# Patient Record
Sex: Male | Born: 1970 | Race: Black or African American | Hispanic: No | Marital: Single | Smoking: Never smoker
Health system: Southern US, Community
[De-identification: ages and names within clinical notes are randomized; demographics above are authoritative.]

---

## 1998-02-19 ENCOUNTER — Emergency Department (HOSPITAL_COMMUNITY): Admission: EM | Admit: 1998-02-19 | Discharge: 1998-02-19 | Payer: Self-pay | Admitting: Emergency Medicine

## 1998-11-05 ENCOUNTER — Emergency Department (HOSPITAL_COMMUNITY): Admission: EM | Admit: 1998-11-05 | Discharge: 1998-11-05 | Payer: Self-pay | Admitting: Emergency Medicine

## 2004-01-25 ENCOUNTER — Emergency Department (HOSPITAL_COMMUNITY): Admission: AD | Admit: 2004-01-25 | Discharge: 2004-01-25 | Payer: Self-pay | Admitting: Family Medicine

## 2004-01-27 ENCOUNTER — Emergency Department (HOSPITAL_COMMUNITY): Admission: EM | Admit: 2004-01-27 | Discharge: 2004-01-27 | Payer: Self-pay | Admitting: Family Medicine

## 2004-07-01 ENCOUNTER — Emergency Department (HOSPITAL_COMMUNITY): Admission: EM | Admit: 2004-07-01 | Discharge: 2004-07-01 | Payer: Self-pay | Admitting: Emergency Medicine

## 2004-07-10 ENCOUNTER — Emergency Department (HOSPITAL_COMMUNITY): Admission: EM | Admit: 2004-07-10 | Discharge: 2004-07-10 | Payer: Self-pay | Admitting: Family Medicine

## 2004-12-01 ENCOUNTER — Emergency Department (HOSPITAL_COMMUNITY): Admission: EM | Admit: 2004-12-01 | Discharge: 2004-12-01 | Payer: Self-pay | Admitting: Family Medicine

## 2004-12-07 ENCOUNTER — Emergency Department (HOSPITAL_COMMUNITY): Admission: EM | Admit: 2004-12-07 | Discharge: 2004-12-07 | Payer: Self-pay | Admitting: Family Medicine

## 2005-05-17 ENCOUNTER — Emergency Department (HOSPITAL_COMMUNITY): Admission: EM | Admit: 2005-05-17 | Discharge: 2005-05-17 | Payer: Self-pay | Admitting: Family Medicine

## 2005-07-29 ENCOUNTER — Emergency Department (HOSPITAL_COMMUNITY): Admission: EM | Admit: 2005-07-29 | Discharge: 2005-07-29 | Payer: Self-pay | Admitting: Emergency Medicine

## 2005-08-04 ENCOUNTER — Emergency Department (HOSPITAL_COMMUNITY): Admission: EM | Admit: 2005-08-04 | Discharge: 2005-08-04 | Payer: Self-pay | Admitting: Family Medicine

## 2006-10-20 ENCOUNTER — Emergency Department (HOSPITAL_COMMUNITY): Admission: EM | Admit: 2006-10-20 | Discharge: 2006-10-20 | Payer: Self-pay | Admitting: Family Medicine

## 2008-01-11 ENCOUNTER — Emergency Department (HOSPITAL_COMMUNITY): Admission: EM | Admit: 2008-01-11 | Discharge: 2008-01-11 | Payer: Self-pay | Admitting: Emergency Medicine

## 2008-06-27 ENCOUNTER — Emergency Department (HOSPITAL_COMMUNITY): Admission: EM | Admit: 2008-06-27 | Discharge: 2008-06-27 | Payer: Self-pay | Admitting: Emergency Medicine

## 2008-11-26 ENCOUNTER — Emergency Department (HOSPITAL_COMMUNITY): Admission: EM | Admit: 2008-11-26 | Discharge: 2008-11-26 | Payer: Self-pay | Admitting: Emergency Medicine

## 2009-05-15 ENCOUNTER — Emergency Department (HOSPITAL_COMMUNITY): Admission: EM | Admit: 2009-05-15 | Discharge: 2009-05-15 | Payer: Self-pay | Admitting: Emergency Medicine

## 2009-11-10 ENCOUNTER — Emergency Department (HOSPITAL_COMMUNITY): Admission: EM | Admit: 2009-11-10 | Discharge: 2009-11-10 | Payer: Self-pay | Admitting: Emergency Medicine

## 2010-09-27 ENCOUNTER — Emergency Department (HOSPITAL_COMMUNITY)
Admission: EM | Admit: 2010-09-27 | Discharge: 2010-09-27 | Payer: Self-pay | Source: Home / Self Care | Admitting: Emergency Medicine

## 2010-12-30 ENCOUNTER — Emergency Department (HOSPITAL_COMMUNITY)
Admission: EM | Admit: 2010-12-30 | Discharge: 2010-12-30 | Disposition: A | Discharge status: Home / Self Care | Payer: Self-pay | Attending: Emergency Medicine | Admitting: Emergency Medicine

## 2010-12-30 DIAGNOSIS — K6289 Other specified diseases of anus and rectum: Secondary | ICD-10-CM | POA: Insufficient documentation

## 2010-12-30 DIAGNOSIS — K649 Unspecified hemorrhoids: Secondary | ICD-10-CM | POA: Insufficient documentation

## 2011-01-04 ENCOUNTER — Inpatient Hospital Stay (INDEPENDENT_AMBULATORY_CARE_PROVIDER_SITE_OTHER)
Admission: RE | Admit: 2011-01-04 | Discharge: 2011-01-04 | Disposition: A | Discharge status: Home / Self Care | Payer: Self-pay | Source: Ambulatory Visit

## 2011-01-04 DIAGNOSIS — R369 Urethral discharge, unspecified: Secondary | ICD-10-CM

## 2011-01-04 LAB — RPR: RPR Ser Ql: REACTIVE — AB

## 2011-01-04 LAB — RPR TITER: RPR Titer: 1:4 {titer} — AB

## 2011-01-04 LAB — HIV ANTIBODY (ROUTINE TESTING W REFLEX): HIV: NONREACTIVE

## 2011-01-08 ENCOUNTER — Inpatient Hospital Stay (INDEPENDENT_AMBULATORY_CARE_PROVIDER_SITE_OTHER)
Admission: RE | Admit: 2011-01-08 | Discharge: 2011-01-08 | Disposition: A | Discharge status: Home / Self Care | Payer: Self-pay | Source: Ambulatory Visit | Attending: Family Medicine | Admitting: Family Medicine

## 2011-01-08 DIAGNOSIS — A539 Syphilis, unspecified: Secondary | ICD-10-CM

## 2011-01-20 LAB — GC/CHLAMYDIA PROBE AMP, GENITAL: Chlamydia, DNA Probe: NEGATIVE

## 2011-01-30 LAB — URINE MICROSCOPIC-ADD ON

## 2011-01-30 LAB — URINALYSIS, ROUTINE W REFLEX MICROSCOPIC
Glucose, UA: NEGATIVE mg/dL
Hgb urine dipstick: NEGATIVE
Protein, ur: NEGATIVE mg/dL
Specific Gravity, Urine: 1.022 (ref 1.005–1.030)
Urobilinogen, UA: 1 mg/dL (ref 0.0–1.0)
pH: 7.5 (ref 5.0–8.0)

## 2012-01-26 ENCOUNTER — Emergency Department (INDEPENDENT_AMBULATORY_CARE_PROVIDER_SITE_OTHER)
Admission: EM | Admit: 2012-01-26 | Discharge: 2012-01-26 | Disposition: A | Discharge status: Home / Self Care | Payer: Self-pay | Source: Home / Self Care | Attending: Family Medicine | Admitting: Family Medicine

## 2012-01-26 ENCOUNTER — Emergency Department (INDEPENDENT_AMBULATORY_CARE_PROVIDER_SITE_OTHER): Payer: Self-pay

## 2012-01-26 ENCOUNTER — Encounter (HOSPITAL_COMMUNITY): Payer: Self-pay

## 2012-01-26 DIAGNOSIS — R05 Cough: Secondary | ICD-10-CM

## 2012-01-26 MED ORDER — BENZONATATE 100 MG PO CAPS: 200.0000 mg | ORAL_CAPSULE | Freq: Three times a day (TID) | ORAL | Status: AC | PRN

## 2012-01-26 MED ORDER — AZITHROMYCIN 250 MG PO TABS: 250.0000 mg | ORAL_TABLET | Freq: Every day | ORAL | Status: AC

## 2012-01-26 NOTE — Discharge Instructions (Signed)
Your xray is concerning for pneumonia. Take the antibiotics as directed; two pills on the first day, and then one pill each day for the next 4 days. Take cough pills as needed and as directed. For your chest soreness, you should take Aleve (or generic naproxen), one to two tablets, every 12 hours. You may also take acetaminophen (Tylenol), up to 1000 mg every 8 hours, in addition to further control your pain and fever. Return to care should your symptoms not improve, or worsen in any way.

## 2012-01-26 NOTE — ED Notes (Signed)
Registration staff requested an expedited evaluation for this pt on basis of chest pn complaint. Pt has had allergy symptoms including nasal drainage and cough since Saturday with chest pn beginning on Sunday. Chest pain is localized to central upper chest. Pt c/o general fatigue since Sunday and mild shortness of breath and a cough that "comes and goes."

## 2012-01-26 NOTE — ED Notes (Signed)
Pt has cough and chest "soreness" for one week.  Taking a herbal medication without improvement.

## 2012-01-26 NOTE — ED Provider Notes (Signed)
History     CSN: [id number]  Arrival date & time 01/26/12  1046   First MD Initiated Contact with Patient 01/26/12 1106      Chief Complaint  Patient presents with  . Cough    (Consider location/radiation/quality/duration/timing/severity/associated sxs/prior treatment) HPI Comments: Douglas Cooper presents for evaluation of persistent cough. He reports chest soreness, and pain with each cough. He reports fever and chills at onset of symptoms. He reports that he has been taking an "over the counter herbal supplement" that has helped. He denies smoking. He also reports that he has been more tired than usual, lately.   Patient is a 41 y.o. male presenting with cough. The history is provided by the patient.  Cough This is a new problem. The problem occurs constantly. The problem has not changed since onset.The cough is productive of sputum. There has been no fever. Associated symptoms include chest pain, chills, rhinorrhea and shortness of breath. He is not a smoker.    History reviewed. No pertinent past medical history.  History reviewed. No pertinent past surgical history.  History reviewed. No pertinent family history.  History  Substance Use Topics  . Smoking status: Never Smoker   . Smokeless tobacco: Not on file  . Alcohol Use: No      Review of Systems  Constitutional: Positive for chills.  HENT: Positive for congestion and rhinorrhea.   Eyes: Negative.   Respiratory: Positive for cough, chest tightness and shortness of breath.   Cardiovascular: Positive for chest pain.  Gastrointestinal: Negative.   Genitourinary: Negative.   Musculoskeletal: Negative.   Skin: Negative.   Neurological: Negative.     Allergies  Review of patient's allergies indicates no known allergies.  Home Medications   Current Outpatient Rx  Name Route Sig Dispense Refill  . OVER THE COUNTER MEDICATION  Herbal medication    . AZITHROMYCIN 250 MG PO TABS Oral Take 1 tablet (250 mg total) by  mouth daily. Take two tablets on first day, then one tablet each day for four days 6 tablet 0  . BENZONATATE 100 MG PO CAPS Oral Take 2 capsules (200 mg total) by mouth every 8 (eight) hours as needed for cough. 30 capsule 0    BP 129/85  Pulse 70  Temp(Src) 98.3 F (36.8 C) (Oral)  Resp 16  SpO2 100%  Physical Exam  Nursing note and vitals reviewed. Constitutional: He is oriented to person, place, and time. He appears well-developed and well-nourished.  HENT:  Head: Normocephalic and atraumatic.  Right Ear: Tympanic membrane normal.  Left Ear: Tympanic membrane normal.  Mouth/Throat: Uvula is midline, oropharynx is clear and moist and mucous membranes are normal.  Eyes: EOM are normal.  Neck: Normal range of motion.  Pulmonary/Chest: Effort normal. He has wheezes in the right middle field, the right lower field and the left lower field. He has no rhonchi.  Musculoskeletal: Normal range of motion.  Neurological: He is alert and oriented to person, place, and time.  Skin: Skin is warm and dry.  Psychiatric: His behavior is normal.    ED Course  Procedures (including critical care time)  Labs Reviewed - No data to display Dg Chest 2 View  01/26/2012  *RADIOLOGY REPORT*  Clinical Data: Cough  CHEST - 2 VIEW  Comparison: Chest radiograph 06/27/2008  Findings: Normal cardiac silhouette.  There is subtle obscuration of the right heart border.  There is trace right pleural effusion. No pneumothorax.  No pulmonary edema. No acute osseous abnormality.  IMPRESSION:  Concern for early right middle lobe or  lower lobe pneumonia.  Original Report Authenticated By: Genevive Bi, M.D.     1. Cough       MDM  Xray reviewed by radiologist and myself; concern for early pneumonia, per report above; rx given for azithromycin and tessalon perles; advised Aleve or ibuprofen for chest pain        Renaee Munda, MD 01/26/12 1500

## 2013-01-12 ENCOUNTER — Emergency Department (HOSPITAL_COMMUNITY)
Admission: EM | Admit: 2013-01-12 | Discharge: 2013-01-12 | Disposition: A | Discharge status: Home / Self Care | Payer: No Typology Code available for payment source | Attending: Emergency Medicine | Admitting: Emergency Medicine

## 2013-01-12 ENCOUNTER — Emergency Department (HOSPITAL_COMMUNITY): Payer: No Typology Code available for payment source

## 2013-01-12 ENCOUNTER — Encounter (HOSPITAL_COMMUNITY): Payer: Self-pay

## 2013-01-12 DIAGNOSIS — Y92410 Unspecified street and highway as the place of occurrence of the external cause: Secondary | ICD-10-CM | POA: Insufficient documentation

## 2013-01-12 DIAGNOSIS — Y9355 Activity, bike riding: Secondary | ICD-10-CM | POA: Insufficient documentation

## 2013-01-12 DIAGNOSIS — S59909A Unspecified injury of unspecified elbow, initial encounter: Secondary | ICD-10-CM | POA: Insufficient documentation

## 2013-01-12 DIAGNOSIS — G939 Disorder of brain, unspecified: Secondary | ICD-10-CM

## 2013-01-12 DIAGNOSIS — S0003XA Contusion of scalp, initial encounter: Secondary | ICD-10-CM | POA: Insufficient documentation

## 2013-01-12 DIAGNOSIS — S6990XA Unspecified injury of unspecified wrist, hand and finger(s), initial encounter: Secondary | ICD-10-CM | POA: Insufficient documentation

## 2013-01-12 DIAGNOSIS — S0083XA Contusion of other part of head, initial encounter: Secondary | ICD-10-CM | POA: Insufficient documentation

## 2013-01-12 DIAGNOSIS — S0093XA Contusion of unspecified part of head, initial encounter: Secondary | ICD-10-CM

## 2013-01-12 MED ORDER — HYDROCODONE-ACETAMINOPHEN 7.5-325 MG/15ML PO SOLN
10.0000 mL | Freq: Once | ORAL | Status: AC
  Administered 2013-01-12: 10 mL via ORAL
  Filled 2013-01-12: qty 15

## 2013-01-12 MED ORDER — HYDROCODONE-ACETAMINOPHEN 5-325 MG PO TABS
1.0000 | ORAL_TABLET | Freq: Four times a day (QID) | ORAL | Status: AC | PRN
Start: 2013-01-12 — End: ?
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
## 2013-01-12 NOTE — ED Notes (Signed)
Patient transported to X-ray 
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
## 2013-01-12 NOTE — Progress Notes (Signed)
During [facility] ED 01/12/13 visit CM spoke with pt who was unsure if he had insurance coverage through his job at "[employer] [address], [place], [place] [address] [phone] Pt does have contact number for his "boss" Thayer Ohm at bedside--[phone]-- who confirmed pt is a self pay [employer] [place] resident with no pcp.  CM discussed and provided written information for Thayer Ohm to review for self pay pcps, importance of pcp for f/u care, www.needymeds.org, discounted pharmacies, MATCH program and other [place] resources such as financial assistance, DSS and  health department Reviewed Health connect number to assist with finding self pay provider close to pt's residence. Reviewed resources for [place] self pay pcps like [facility], family medicine at [address], [facility] family practice, general medical clinics, [facility] urgent care plus others, [facility] out patient [facility]ies, housing, affordable care act/health reform (deadline 01/12/13) and other resources in [employer]. Pt voiced understanding and appreciation of resources provided

## 2013-01-12 NOTE — ED Notes (Signed)
Pt escorted to discharge window. Verbalized understanding discharge instructions. In no acute distress.   

## 2013-01-15 NOTE — ED Provider Notes (Signed)
Medical screening examination/treatment/procedure(s) were performed by non-physician practitioner and as supervising physician I was immediately available for consultation/collaboration.  Brandi Armato, MD 01/15/13 0718 

## 2015-03-01 ENCOUNTER — Emergency Department (HOSPITAL_COMMUNITY)
Admission: EM | Admit: 2015-03-01 | Discharge: 2015-03-01 | Disposition: A | Discharge status: Home / Self Care | Payer: No Typology Code available for payment source | Attending: Emergency Medicine | Admitting: Emergency Medicine

## 2015-03-01 ENCOUNTER — Encounter (HOSPITAL_COMMUNITY): Payer: Self-pay

## 2015-03-01 DIAGNOSIS — S6992XA Unspecified injury of left wrist, hand and finger(s), initial encounter: Secondary | ICD-10-CM | POA: Diagnosis present

## 2015-03-01 DIAGNOSIS — Y288XXA Contact with other sharp object, undetermined intent, initial encounter: Secondary | ICD-10-CM | POA: Diagnosis not present

## 2015-03-01 DIAGNOSIS — Y998 Other external cause status: Secondary | ICD-10-CM | POA: Diagnosis not present

## 2015-03-01 DIAGNOSIS — Y9289 Other specified places as the place of occurrence of the external cause: Secondary | ICD-10-CM | POA: Diagnosis not present

## 2015-03-01 DIAGNOSIS — Y9339 Activity, other involving climbing, rappelling and jumping off: Secondary | ICD-10-CM | POA: Diagnosis not present

## 2015-03-01 DIAGNOSIS — Z23 Encounter for immunization: Secondary | ICD-10-CM | POA: Diagnosis not present

## 2015-03-01 DIAGNOSIS — S61412A Laceration without foreign body of left hand, initial encounter: Secondary | ICD-10-CM | POA: Diagnosis not present

## 2015-03-01 MED ORDER — SULFAMETHOXAZOLE-TRIMETHOPRIM 800-160 MG PO TABS
1.0000 | ORAL_TABLET | Freq: Once | ORAL | Status: AC
  Administered 2015-03-01: 1 via ORAL
  Filled 2015-03-01: qty 1

## 2015-03-01 MED ORDER — LIDOCAINE HCL (PF) 1 % IJ SOLN
5.0000 mL | Freq: Once | INTRAMUSCULAR | Status: AC
  Administered 2015-03-01: 5 mL via INTRADERMAL
  Filled 2015-03-01: qty 5

## 2015-03-01 MED ORDER — SULFAMETHOXAZOLE-TRIMETHOPRIM 800-160 MG PO TABS: 1.0000 | ORAL_TABLET | Freq: Two times a day (BID) | ORAL | Status: AC

## 2015-03-01 MED ORDER — TETANUS-DIPHTH-ACELL PERTUSSIS 5-2.5-18.5 LF-MCG/0.5 IM SUSP
0.5000 mL | Freq: Once | INTRAMUSCULAR | Status: AC
  Administered 2015-03-01: 0.5 mL via INTRAMUSCULAR
  Filled 2015-03-01: qty 0.5

## 2015-03-01 NOTE — ED Notes (Signed)
Pt with laceration to left hand.  Pt was running from dog and jumped fence. Hand laceration from fence.  Event happened 2 days ago.  ? Tetanus unknown.

## 2015-03-01 NOTE — Discharge Instructions (Signed)
As discussed, with the delay in addressing your hand injury is very important that you monitor the wound carefully, and do not hesitate to return if you develop new, or concerning changes in your condition.  Please take all medication as directed, keep the wound clean, dry.

## 2015-03-01 NOTE — ED Provider Notes (Signed)
CSN: [id number]     Arrival date & time 03/01/15  0844 History   First MD Initiated Contact with Patient 03/01/15 [phone]     Chief Complaint  Patient presents with  . Extremity Laceration     (Consider location/radiation/quality/duration/timing/severity/associated sxs/prior Treatment) HPI Patient presents with concern of open the left hand wound. The patient caught his hand on a barb wire fence 2 days ago. Since that time there's been pain persistently, sharp severe in the palmar distal hand. Patient has no loss of sensation or strength in any of the associated digits, no fever, chills. Patient presents today due to protruding soft tissue through the wound. History reviewed. No pertinent past medical history. History reviewed. No pertinent past surgical history. History reviewed. No pertinent family history. History  Substance Use Topics  . Smoking status: Never Smoker   . Smokeless tobacco: Not on file  . Alcohol Use: No    Review of Systems  Constitutional: Negative for fever.  Respiratory: Negative for shortness of breath.   Cardiovascular: Negative for chest pain.  Musculoskeletal:       Negative aside from HPI  Skin:       Negative aside from HPI  Allergic/Immunologic: Negative for immunocompromised state.  Neurological: Negative for weakness.      Allergies  Review of patient's allergies indicates no known allergies.  Home Medications   Prior to Admission medications   Medication Sig Start Date End Date Taking? Authorizing Provider  HYDROcodone-acetaminophen (NORCO/VICODIN) 5-325 MG per tablet Take 1 tablet by mouth every 6 (six) hours as needed for pain. Patient not taking: Reported on 03/01/2015 01/12/13   Jaci CarrelLisette Paz, PA-C  sulfamethoxazole-trimethoprim (BACTRIM DS,SEPTRA DS) 800-160 MG per tablet Take 1 tablet by mouth 2 (two) times daily. 03/01/15 03/08/15  Gerhard Munchobert [place], MD   BP 132/81 mmHg  Pulse 78  Temp(Src) 97.9 F (36.6 C) (Oral)  Resp 16  SpO2  99% Physical Exam  Constitutional: He is oriented to person, place, and time. He appears well-developed. No distress.  HENT:  Head: Normocephalic and atraumatic.  Eyes: Conjunctivae and EOM are normal.  Cardiovascular: Normal rate, regular rhythm and intact distal pulses.   Pulmonary/Chest: Effort normal. No stridor. No respiratory distress.  Abdominal: He exhibits no distension.  Musculoskeletal: He exhibits no edema.  Left hand patient flexes and extends all digits properly, wrist appropriately. About the midpoint of the distal palmar surface there is a 4 cm L-shaped laceration with protrusion of soft tissue, approximately 1 cm external to the wound  Neurological: He is alert and oriented to person, place, and time.  Skin: Skin is warm and dry.  Psychiatric: He has a normal mood and affect.  Nursing note and vitals reviewed.   ED Course  Procedures (including critical care time) After soaking for 20 minutes in Betadine solution, rinsing, the patient had wound closure  LACERATION REPAIR Performed by: Gerhard MunchLOCKWOOD, Mimie Goering Authorized by: Gerhard MunchLOCKWOOD, Chrisoula Zegarra Consent: Verbal consent obtained. Risks and benefits: risks, benefits and alternatives were discussed Consent given by: patient Patient identity confirmed: provided demographic data Prepped and Draped in normal sterile fashion Wound explored  Laceration Location: L hand palmar surface  Laceration Length: 4cm  No Foreign Bodies seen or palpated  Anesthesia: local infiltration  Local anesthetic: lidocaine 1% no epinephrine  Anesthetic total: 3 ml  Irrigation method: syringe Amount of cleaning: standard  Skin closure: 5-0 sutures  Number of sutures: 3  Technique: rough approximation to contain soft tissue  Patient tolerance: Patient tolerated the procedure well  with no immediate complications.   MDM   Final diagnoses:  Laceration of hand, left, initial encounter    Patient presents with a 562-day-old wound to his  left hand. Given the protrusion of soft tissue, the area, concern for infection, the wound was roughly approximated, to decrease likelihood for infection. Patient was also started on antibiotics, provided exposer return precautions, follow-up instructions.    Gerhard Munchobert Trevian Hayashida, MD 03/01/15 1011

## 2015-03-01 NOTE — ED Notes (Signed)
Pt soaking in hand in iodine. MD will plan to place sutures after 20-30 min of soaking.

## 2015-03-08 ENCOUNTER — Emergency Department (HOSPITAL_COMMUNITY)
Admission: EM | Admit: 2015-03-08 | Discharge: 2015-03-08 | Disposition: A | Discharge status: Home / Self Care | Payer: No Typology Code available for payment source | Attending: Emergency Medicine | Admitting: Emergency Medicine

## 2015-03-08 ENCOUNTER — Encounter (HOSPITAL_COMMUNITY): Payer: Self-pay | Admitting: Emergency Medicine

## 2015-03-08 DIAGNOSIS — Z4802 Encounter for removal of sutures: Secondary | ICD-10-CM | POA: Insufficient documentation

## 2015-03-08 NOTE — ED Notes (Signed)
Pt reports he had 3 stitches placed in L hand on Tuesday. Pt was washing dishes when stitches popped open. No bleeding at present.

## 2015-03-08 NOTE — Discharge Instructions (Signed)

## 2015-03-08 NOTE — ED Provider Notes (Signed)
CSN: [id number]     Arrival date & time 03/08/15  [id number] History   First MD Initiated Contact with Patient 03/08/15 [phone]     Chief Complaint  Patient presents with  . Suture / Staple Removal     (Consider location/radiation/quality/duration/timing/severity/associated sxs/prior Treatment) HPI Comments: Pt had sutures placed in his left hand 1 week ago. Pt state that the it looks like it started to separate while he was washing dishes. No redness, warmth or drainage  Patient is a 44 y.o. male presenting with suture removal. The history is provided by the patient. No language interpreter was used.  Suture / Staple Removal This is a new problem. The current episode started today. The problem occurs constantly. The problem has been unchanged. Pertinent negatives include no numbness. Nothing aggravates the symptoms. He has tried nothing for the symptoms.    History reviewed. No pertinent past medical history. History reviewed. No pertinent past surgical history. History reviewed. No pertinent family history. History  Substance Use Topics  . Smoking status: Never Smoker   . Smokeless tobacco: Not on file  . Alcohol Use: No    Review of Systems  Neurological: Negative for numbness.      Allergies  Review of patient's allergies indicates no known allergies.  Home Medications   Prior to Admission medications   Medication Sig Start Date End Date Taking? Authorizing Provider  HYDROcodone-acetaminophen (NORCO/VICODIN) 5-325 MG per tablet Take 1 tablet by mouth every 6 (six) hours as needed for pain. Patient not taking: Reported on 03/01/2015 01/12/13   Jaci CarrelLisette Paz, PA-C  sulfamethoxazole-trimethoprim (BACTRIM DS,SEPTRA DS) 800-160 MG per tablet Take 1 tablet by mouth 2 (two) times daily. 03/01/15 03/08/15  Gerhard Munchobert Lockwood, MD   There were no vitals taken for this visit. Physical Exam  Constitutional: He is oriented to person, place, and time.  Cardiovascular: Normal rate and regular  rhythm.   Pulmonary/Chest: Effort normal and breath sounds normal.  Neurological: He is alert and oriented to person, place, and time.  Skin:  Wound that in not opening or draining. No redness or warmth. No well approximated  Nursing note and vitals reviewed.   ED Course  SUTURE REMOVAL Date/Time: 03/08/2015 9:07 AM Performed by: Teressa LowerPICKERING, Melvern Ramone Authorized by: Teressa LowerPICKERING, Diavian Furgason Consent: Verbal consent obtained. Consent given by: patient Patient identity confirmed: verbally with patient Wound Appearance: clean Sutures Removed: 3 Patient tolerance: Patient tolerated the procedure well with no immediate complications   (including critical care time) Labs Review Labs Reviewed - No data to display  Imaging Review No results found.   EKG Interpretation None      MDM   Final diagnoses:  Visit for suture removal    Sutures removed. No sign of infection noted.    Teressa LowerVrinda Cato Liburd, NP 03/08/15 [id number]  Benjiman CoreNathan Jozey Janco, MD 03/08/15 1018

## 2017-11-08 DIAGNOSIS — S0180XA Unspecified open wound of other part of head, initial encounter: Secondary | ICD-10-CM | POA: Insufficient documentation

## 2017-11-08 DIAGNOSIS — Y9289 Other specified places as the place of occurrence of the external cause: Secondary | ICD-10-CM | POA: Insufficient documentation

## 2017-11-08 DIAGNOSIS — W272XXA Contact with scissors, initial encounter: Secondary | ICD-10-CM | POA: Insufficient documentation

## 2017-11-08 DIAGNOSIS — Y9389 Activity, other specified: Secondary | ICD-10-CM | POA: Insufficient documentation

## 2017-11-08 DIAGNOSIS — Y999 Unspecified external cause status: Secondary | ICD-10-CM | POA: Insufficient documentation

## 2017-11-09 ENCOUNTER — Emergency Department (HOSPITAL_COMMUNITY)
Admission: EM | Admit: 2017-11-09 | Discharge: 2017-11-09 | Disposition: A | Discharge status: Home / Self Care | Payer: No Typology Code available for payment source | Attending: Emergency Medicine | Admitting: Emergency Medicine

## 2017-11-09 ENCOUNTER — Encounter (HOSPITAL_COMMUNITY): Payer: Self-pay | Admitting: Emergency Medicine

## 2017-11-09 ENCOUNTER — Other Ambulatory Visit: Payer: Self-pay

## 2017-11-09 DIAGNOSIS — T148XXA Other injury of unspecified body region, initial encounter: Secondary | ICD-10-CM

## 2017-11-09 MED ORDER — BACITRACIN ZINC 500 UNIT/GM EX OINT
TOPICAL_OINTMENT | CUTANEOUS | Status: AC
  Administered 2017-11-09: 2
  Filled 2017-11-09: qty 1.8

## 2017-11-09 NOTE — Discharge Instructions (Signed)
Apply bacitracin ointment twice per day as needed.  You may take Tylenol or ibuprofen for discomfort.  Keep clean with warm soap and water.  Follow-up with a primary care doctor as needed.

## 2017-11-09 NOTE — ED Triage Notes (Signed)
Patient was getting haircut. Benna DunksBarber cut the mole off the right side of his face.

## 2017-11-09 NOTE — ED Notes (Signed)
Pt has a mole on the right side of his face. The mole has been cut while the pt was having his hair cut, bleeding is controlled, pt denies pain or any other complaints.

## 2017-11-09 NOTE — ED Provider Notes (Signed)
[facility]-EMERGENCY DEPT Provider Note   CSN: [id number] Arrival date & time: 11/08/17  2333     History   Chief Complaint Chief Complaint  Patient presents with  . Facial Laceration    HPI Hortencia PilarCalvin T Krysiak is a 47 y.o. male.  47 year old male presents to the emergency department for evaluation of a laceration to his scalp.  Patient states that he was having his haircut by his brother when the razor cut a skin tag on the side of his face.  Patient expresses concern about persistent bleeding prior to arrival.  Bleeding controlled prior to my evaluation of the patient.  He has no complaints of pain or tenderness.  No medications taken prior to arrival for symptoms.  No use of chronic anticoagulants.   The history is provided by the patient. No language interpreter was used.    History reviewed. No pertinent past medical history.  There are no active problems to display for this patient.   History reviewed. No pertinent surgical history.     Home Medications    Prior to Admission medications   Medication Sig Start Date End Date Taking? Authorizing Provider  HYDROcodone-acetaminophen (NORCO/VICODIN) 5-325 MG per tablet Take 1 tablet by mouth every 6 (six) hours as needed for pain. Patient not taking: Reported on 03/01/2015 01/12/13   Jaci CarrelPaz, Lisette, PA-C    Family History History reviewed. No pertinent family history.  Social History Social History   Tobacco Use  . Smoking status: Never Smoker  . Smokeless tobacco: Never Used  Substance Use Topics  . Alcohol use: No  . Drug use: No     Allergies   Patient has no known allergies.   Review of Systems Review of Systems Ten systems reviewed and are negative for acute change, except as noted in the HPI.    Physical Exam Updated Vital Signs BP (!) 132/93 (BP Location: Left Arm)   Pulse 67   Temp 98.4 F (36.9 C) (Oral)   Resp 13   Ht 5\' 7"  (1.702 m)   Wt 68 kg (150 lb)   SpO2 100%    BMI 23.49 kg/m   Physical Exam  Constitutional: He is oriented to person, place, and time. He appears well-developed and well-nourished. No distress.  Nontoxic appearing and in no acute distress  HENT:  Head: Normocephalic and atraumatic.    Grossly poor dentition  Eyes: Conjunctivae and EOM are normal. No scleral icterus.  Neck: Normal range of motion.  Pulmonary/Chest: Effort normal. No respiratory distress.  Respirations even and unlabored  Musculoskeletal: Normal range of motion.  Neurological: He is alert and oriented to person, place, and time. He exhibits normal muscle tone. Coordination normal.  Ambulatory with steady gait.  Skin: Skin is warm and dry. No rash noted. He is not diaphoretic. No erythema. No pallor.  Avulsion to skin tag of right temple.  No active bleeding; bleeding controlled prior to evaluation.  No tenderness to palpation.  Psychiatric: He has a normal mood and affect. His behavior is normal.  Nursing note and vitals reviewed.    ED Treatments / Results  Labs (all labs ordered are listed, but only abnormal results are displayed) Labs Reviewed - No data to display  EKG  EKG Interpretation None       Radiology No results found.  Procedures Procedures (including critical care time)  Medications Ordered in ED Medications - No data to display   Initial Impression / Assessment and Plan / ED Course  I have reviewed the triage vital signs and the nursing notes.  Pertinent labs & imaging results that were available during my care of the patient were reviewed by me and considered in my medical decision making (see chart for details).     47 year old male presents for avulsion to skin tag of the right temple.  Bleeding controlled.  No indication for repair.  No associated tenderness to palpation.  Patient stable for outpatient supportive management.  Return precautions discussed and provided. Patient discharged in stable condition with no  unaddressed concerns.   Final Clinical Impressions(s) / ED Diagnoses   Final diagnoses:  Skin avulsion    ED Discharge Orders    None       Antony Madura, PA-C 11/09/17 2130    Palumbo, April, MD 11/09/17 [phone]

## 2019-08-02 ENCOUNTER — Emergency Department (HOSPITAL_COMMUNITY)
Admission: EM | Admit: 2019-08-02 | Discharge: 2019-08-02 | Disposition: A | Discharge status: Home / Self Care | Payer: No Typology Code available for payment source | Attending: Emergency Medicine | Admitting: Emergency Medicine

## 2019-08-02 ENCOUNTER — Emergency Department (HOSPITAL_COMMUNITY): Payer: No Typology Code available for payment source

## 2019-08-02 ENCOUNTER — Encounter (HOSPITAL_COMMUNITY): Payer: Self-pay | Admitting: Emergency Medicine

## 2019-08-02 ENCOUNTER — Other Ambulatory Visit: Payer: Self-pay

## 2019-08-02 DIAGNOSIS — Z20828 Contact with and (suspected) exposure to other viral communicable diseases: Secondary | ICD-10-CM | POA: Insufficient documentation

## 2019-08-02 DIAGNOSIS — R079 Chest pain, unspecified: Secondary | ICD-10-CM | POA: Diagnosis present

## 2019-08-02 LAB — CBC
HCT: 49.7 % (ref 39.0–52.0)
Hemoglobin: 16.7 g/dL (ref 13.0–17.0)
MCH: 29 pg (ref 26.0–34.0)
MCHC: 33.6 g/dL (ref 30.0–36.0)
MCV: 86.4 fL (ref 80.0–100.0)
Platelets: 219 10*3/uL (ref 150–400)
RBC: 5.75 MIL/uL (ref 4.22–5.81)
RDW: 13.8 % (ref 11.5–15.5)
WBC: 5.4 10*3/uL (ref 4.0–10.5)
nRBC: 0 % (ref 0.0–0.2)

## 2019-08-02 LAB — BASIC METABOLIC PANEL
Anion gap: 8 (ref 5–15)
BUN: 8 mg/dL (ref 6–20)
CO2: 31 mmol/L (ref 22–32)
Calcium: 9.7 mg/dL (ref 8.9–10.3)
Chloride: 101 mmol/L (ref 98–111)
Creatinine, Ser: 0.92 mg/dL (ref 0.61–1.24)
GFR calc Af Amer: >60 mL/min (ref >60)
GFR calc non Af Amer: >60 mL/min (ref >60)
Glucose, Bld: 109 mg/dL — ABNORMAL HIGH (ref 70–99)
Potassium: 4.9 mmol/L (ref 3.5–5.1)
Sodium: 140 mmol/L (ref 135–145)

## 2019-08-02 LAB — TROPONIN I (HIGH SENSITIVITY): Troponin I (High Sensitivity): 3 ng/L (ref <18)

## 2019-08-02 MED ORDER — IBUPROFEN 400 MG PO TABS: 400.0000 mg | ORAL_TABLET | Freq: Four times a day (QID) | ORAL | 0 refills | Status: DC | PRN

## 2019-08-02 NOTE — ED Triage Notes (Signed)
Per pt, states CP radiating to back for 3 days-states he took some tylenol and it went away

## 2019-08-02 NOTE — Discharge Instructions (Signed)
Recommend follow-up with your primary doctor.  Recommend Tylenol, Motrin as needed for pain control.  Please return to ER if you develop worsening pain, difficulty breathing, fever or other new concerning symptom.  Recommend isolation precautions until we have the results of your coronavirus test.

## 2019-08-02 NOTE — ED Provider Notes (Signed)
[facility] DEPT Provider Note   CSN: [id number] Arrival date & time: 08/02/19  1532     History   Chief Complaint Chief Complaint  Patient presents with   Chest Pain    HPI Douglas Cooper is a 48 y.o. male.  Presents emerged part with chief complaint of chest pain.  Patient states his symptoms started approximately 3 days ago, left-sided, sharp, stabbing, radiates to back.  Patient states that pain went away yesterday after he took some Tylenol.  Today is not having any pain.  He denies any difficulty breathing.  Has noted a cough recently.  Dry, nonproductive.  No blood.  No fever, no sick contacts.     HPI  History reviewed. No pertinent past medical history.  There are no active problems to display for this patient.   History reviewed. No pertinent surgical history.      Home Medications    Prior to Admission medications   Medication Sig Start Date End Date Taking? Authorizing Provider  HYDROcodone-acetaminophen (NORCO/VICODIN) 5-325 MG per tablet Take 1 tablet by mouth every 6 (six) hours as needed for pain. Patient not taking: Reported on 03/01/2015 01/12/13   Verl Dicker, PA-C  ibuprofen (ADVIL) 400 MG tablet Take 1 tablet (400 mg total) by mouth every 6 (six) hours as needed. 08/02/19   Lucrezia Starch, MD    Family History No family history on file.  Social History Social History   Tobacco Use   Smoking status: Never Smoker   Smokeless tobacco: Never Used  Substance Use Topics   Alcohol use: No   Drug use: No     Allergies   Patient has no known allergies.   Review of Systems Review of Systems  Constitutional: Negative for chills and fever.  HENT: Negative for ear pain and sore throat.   Eyes: Negative for pain and visual disturbance.  Respiratory: Negative for cough and shortness of breath.   Cardiovascular: Negative for chest pain and palpitations.  Gastrointestinal: Negative for abdominal pain and  vomiting.  Genitourinary: Negative for dysuria and hematuria.  Musculoskeletal: Negative for arthralgias and back pain.  Skin: Negative for color change and rash.  Neurological: Negative for seizures and syncope.  All other systems reviewed and are negative.    Physical Exam Updated Vital Signs BP (!) 151/102 (BP Location: Right Arm)    Pulse 69    Temp 98 F (36.7 C) (Oral)    Resp 18    SpO2 99%   Physical Exam Vitals signs and nursing note reviewed.  Constitutional:      Appearance: He is well-developed.  HENT:     Head: Normocephalic and atraumatic.  Eyes:     Conjunctiva/sclera: Conjunctivae normal.  Neck:     Musculoskeletal: Neck supple.  Cardiovascular:     Rate and Rhythm: Normal rate and regular rhythm.     Heart sounds: No murmur.  Pulmonary:     Effort: Pulmonary effort is normal. No respiratory distress.     Breath sounds: Normal breath sounds.  Abdominal:     Palpations: Abdomen is soft.     Tenderness: There is no abdominal tenderness.  Musculoskeletal:     Right lower leg: No edema.     Left lower leg: No edema.  Skin:    General: Skin is warm and dry.     Capillary Refill: Capillary refill takes less than 2 seconds.  Neurological:     Mental Status: He is alert.  ED Treatments / Results  Labs (all labs ordered are listed, but only abnormal results are displayed) Labs Reviewed  BASIC METABOLIC PANEL - Abnormal; Notable for the following components:      Result Value   Glucose, Bld 109 (*)    All other components within normal limits  NOVEL CORONAVIRUS, NAA (HOSP ORDER, SEND-OUT TO REF LAB; TAT 18-24 HRS)  CBC  TROPONIN I (HIGH SENSITIVITY)  TROPONIN I (HIGH SENSITIVITY)    EKG EKG Interpretation  Date/Time:  Sunday August 02 2019 15:45:32 EDT Ventricular Rate:  71 PR Interval:    QRS Duration: 88 QT Interval:  359 QTC Calculation: 391 R Axis:   91 Text Interpretation:  Sinus rhythm ST elev, probable normal early repol pattern  No recipricol ST depressions no acute STEMI Confirmed by Alexiz Cothran ([id number]) on 08/02/2019 6:45:16 PM   Radiology Dg Chest 2 View  Result Date: 08/02/2019 CLINICAL DATA:  Chest pain EXAM: CHEST - 2 VIEW COMPARISON:  None. FINDINGS: Lungs are clear.  No pleural effusion or pneumothorax. The heart is normal in size. Visualized osseous structures are within normal limits. IMPRESSION: Normal chest radiographs. Electronically Signed   By: Sriyesh  Krishnan M.D.   On: 08/02/2019 16:19    Procedures Procedures (including critical care time)  Medications Ordered in ED Medications - No data to display   Initial Impression / Assessment and Plan / ED Course  I have reviewed the triage vital signs and the nursing notes.  Pertinent labs & imaging results that were available during my care of the patient were reviewed by me and considered in my medical decision making (see chart for details).       -year-old male presents to ER with cough, chest pain.  Patient actually reports not having any chest pain today.  His EKG was without acute ischemic changes, troponin within normal limits, labs otherwise unremarkable.  Chest x-ray negative for pneumonia.  Suspect possible muscle strain versus viral respiratory illness.  Will swab for COVID-19 and discharged home.    After the discussed management above, the patient was determined to be safe for discharge.  The patient was in agreement with this plan and all questions regarding their care were answered.  ED return precautions were discussed and the patient will return to the ED with any significant worsening of condition.    Final Clinical Impressions(s) / ED Diagnoses   Final diagnoses:  Chest pain, unspecified type    ED Discharge Orders         Ordered    ibuprofen (ADVIL) 400 MG tablet  Every 6 hours PRN     10 /18/20 1911           [birth year]1999, MD 08/02/19 2241

## 2019-08-04 LAB — NOVEL CORONAVIRUS, NAA (HOSP ORDER, SEND-OUT TO REF LAB; TAT 18-24 HRS): SARS-CoV-2, NAA: NOT DETECTED

## 2020-06-17 ENCOUNTER — Encounter (HOSPITAL_COMMUNITY): Payer: Self-pay

## 2020-06-17 ENCOUNTER — Emergency Department (HOSPITAL_COMMUNITY)
Admission: EM | Admit: 2020-06-17 | Discharge: 2020-06-17 | Disposition: A | Discharge status: Home / Self Care | Payer: No Typology Code available for payment source | Attending: Emergency Medicine | Admitting: Emergency Medicine

## 2020-06-17 ENCOUNTER — Other Ambulatory Visit: Payer: Self-pay

## 2020-06-17 DIAGNOSIS — Y939 Activity, unspecified: Secondary | ICD-10-CM | POA: Insufficient documentation

## 2020-06-17 DIAGNOSIS — W19XXXA Unspecified fall, initial encounter: Secondary | ICD-10-CM

## 2020-06-17 DIAGNOSIS — R0981 Nasal congestion: Secondary | ICD-10-CM | POA: Insufficient documentation

## 2020-06-17 DIAGNOSIS — Y999 Unspecified external cause status: Secondary | ICD-10-CM | POA: Insufficient documentation

## 2020-06-17 DIAGNOSIS — S50312A Abrasion of left elbow, initial encounter: Secondary | ICD-10-CM | POA: Insufficient documentation

## 2020-06-17 DIAGNOSIS — Y929 Unspecified place or not applicable: Secondary | ICD-10-CM | POA: Insufficient documentation

## 2020-06-17 DIAGNOSIS — R0989 Other specified symptoms and signs involving the circulatory and respiratory systems: Secondary | ICD-10-CM

## 2020-06-17 DIAGNOSIS — Z20822 Contact with and (suspected) exposure to covid-19: Secondary | ICD-10-CM | POA: Insufficient documentation

## 2020-06-17 LAB — SARS CORONAVIRUS 2 BY RT PCR (HOSPITAL ORDER, PERFORMED IN ~~LOC~~ HOSPITAL LAB): SARS Coronavirus 2: NEGATIVE

## 2020-06-17 MED ORDER — BACITRACIN ZINC 500 UNIT/GM EX OINT
TOPICAL_OINTMENT | CUTANEOUS | Status: AC
  Filled 2020-06-17: qty 0.9

## 2020-06-17 NOTE — ED Triage Notes (Addendum)
Patient states he fell off of his scooter and today he states he began having right lower back pain. Patient states he took Ibuprofen at 1100 and is not having pain at this time.  Patient also has an abrasion to the left elbow area.

## 2020-06-17 NOTE — ED Provider Notes (Signed)
[facility]-EMERGENCY DEPT Provider Note   CSN: [id number] Arrival date & time: 06/17/20  1318     History Chief Complaint  Patient presents with  . Back Pain  . elbow abrasion    Douglas Cooper is a 49 y.o. male.  HPI  Patient is a 49 year old male with no pertinent past medical history presented today with left elbow abrasion, some backaches and congestion.  Patient states he fell off his scooter this morning and states that he had some low back pain.  He states that this is resolved now after he took some ibuprofen.  He states he currently has no pain no complaints.  He does have an abrasion on his left elbow which he states is somewhat achy.  He states it is not significantly painful however.  He denies any head injury or loss of consciousness.  He states he is otherwise feeling well.  Denies any chest pain or shortness of breath abdominal pain lightheadedness or dizziness.  No aggravating or mitigating factors.  Severity of pain is 0/10.     History reviewed. No pertinent past medical history.  There are no problems to display for this patient.   History reviewed. No pertinent surgical history.     Family History  Family history unknown: Yes    Social History   Tobacco Use  . Smoking status: Never Smoker  . Smokeless tobacco: Never Used  Vaping Use  . Vaping Use: Never used  Substance Use Topics  . Alcohol use: No  . Drug use: No    Home Medications Prior to Admission medications   Medication Sig Start Date End Date Taking? Authorizing Provider  HYDROcodone-acetaminophen (NORCO/VICODIN) 5-325 MG per tablet Take 1 tablet by mouth every 6 (six) hours as needed for pain. Patient not taking: Reported on 03/01/2015 01/12/13   Jaci Carrel, PA-C  ibuprofen (ADVIL) 400 MG tablet Take 1 tablet (400 mg total) by mouth every 6 (six) hours as needed. 08/02/19   Milagros Loll, MD    Allergies    Patient has no known allergies.  Review of  Systems   Review of Systems  Constitutional: Negative for chills and fever.  HENT: Negative for congestion.   Respiratory: Negative for shortness of breath.   Cardiovascular: Negative for chest pain.  Gastrointestinal: Negative for abdominal pain.  Musculoskeletal: Negative for neck pain.       Elbow abrasion, elbow ache, back ache    Physical Exam Updated Vital Signs BP (!) 142/87 (BP Location: Left Arm)   Pulse 80   Temp 97.7 F (36.5 C) (Oral)   Resp 14   Ht 5\' 8"  (1.727 m)   Wt 73 kg   SpO2 95%   BMI 24.48 kg/m   Physical Exam Vitals and nursing note reviewed.  Constitutional:      General: He is not in acute distress. HENT:     Head: Normocephalic and atraumatic.     Nose: Nose normal.     Mouth/Throat:     Mouth: Mucous membranes are moist.  Eyes:     General: No scleral icterus. Neck:     Comments: For range of motion of the C-spine.  No midline tenderness palpation. Cardiovascular:     Rate and Rhythm: Normal rate and regular rhythm.     Pulses: Normal pulses.     Heart sounds: Normal heart sounds.  Pulmonary:     Effort: Pulmonary effort is normal. No respiratory distress.     Breath  sounds: No wheezing.  Abdominal:     Palpations: Abdomen is soft.     Tenderness: There is no abdominal tenderness. There is no guarding or rebound.  Musculoskeletal:     Cervical back: Normal range of motion.     Right lower leg: No edema.     Left lower leg: No edema.     Comments: Small superficial abrasions of left elbow no tenderness to palpation of the left elbow.  No tenderness to palpation of C, T, L-spine.  No focal bony tenderness of the upper or lower extremities hip chest or head.  Skin:    General: Skin is warm and dry.     Capillary Refill: Capillary refill takes less than 2 seconds.  Neurological:     Mental Status: He is alert. Mental status is at baseline.     Comments: Sensation intact in all 4 extremities.  Ambulatory without any abnormalities.    Psychiatric:        Mood and Affect: Mood normal.        Behavior: Behavior normal.     ED Results / Procedures / Treatments   Labs (all labs ordered are listed, but only abnormal results are displayed) Labs Reviewed  SARS CORONAVIRUS 2 BY RT PCR (HOSPITAL ORDER, PERFORMED IN [facility])    EKG None  Radiology No results found.  Procedures Procedures (including critical care time)  Medications Ordered in ED Medications - No data to display  ED Course  I have reviewed the triage vital signs and the nursing notes.  Pertinent labs & imaging results that were available during my care of the patient were reviewed by me and considered in my medical decision making (see chart for details).    MDM Rules/Calculators/A&P                           Patient is 49 year old male with pertinent past medical history detailed above presented today for left elbow abrasion.  He did have some low back pain earlier but it went away with ibuprofen.  He states no significant pain at this time.  Physical exam is reassuring.  Patient's left arm abrasion was cleaned and redressed with nonadhesive bandaging and patient was given wound care precautions and return precautions as well as wound care instructions.  No negation for imaging doubt fracture.  He has no bony tenderness anywhere.  Overall is well-appearing and has no complaints of pain at this time.  Douglas Cooper was evaluated in Emergency Department on 06/17/2020 for the symptoms described in the history of present illness. He was evaluated in the context of the global COVID-19 pandemic, which necessitated consideration that the patient might be at risk for infection with the SARS-CoV-2 virus that causes COVID-19. Institutional protocols and algorithms that pertain to the evaluation of patients at risk for COVID-19 are in a state of rapid change based on information released by regulatory bodies including the CDC and federal and  state organizations. These policies and algorithms were followed during the patient's care in the ED.  Final Clinical Impression(s) / ED Diagnoses Final diagnoses:  Abrasion of left elbow, initial encounter  Fall, initial encounter  Runny nose  Suspected COVID-19 virus infection    Rx / DC Orders ED Discharge Orders    None       Gailen Shelter, [place] 06/17/20 1618    Mancel Bale, MD 06/18/20 [phone]

## 2020-06-17 NOTE — Discharge Instructions (Signed)
Please continue to use Tylenol 1000 mg every 6 hours for pain you may also use ibuprofen 600 mg every 6 hours.  You may alternate between the 2 of these.  Please drink plenty of water you may use warm compresses and do gentle stretching.   Your COVID test is pending; you should expect results in 2-3 days. You can access your results on your MyChart--if you test positive you should receive a phone call.  In the meantime follow CDC guidelines and quarantine, wear a mask, wash hands often.   Please take over the counter vitamin D 2000-4000 units per day. I also recommend zinc 50 mg per day for the next two weeks.   Please return to ED if you feel have difficulty breathing or have emergent, new or concerning symptoms.  Patients who have symptoms consistent with COVID-19 should self isolated for: At least 3 days (72 hours) have passed since recovery, defined as resolution of fever without the use of fever reducing medications and improvement in respiratory symptoms (e.g., cough, shortness of breath), and At least 7 days have passed since symptoms first appeared.       Person Under Monitoring Name: Douglas Cooper  Location: [address] [place] [address]   Infection Prevention Recommendations for Individuals Confirmed to have, or Being Evaluated for, 2019 Novel Coronavirus (COVID-19) Infection Who Receive Care at Home  Individuals who are confirmed to have, or are being evaluated for, COVID-19 should follow the prevention steps below until a healthcare provider or local or state health department says they can return to normal activities.  Stay home except to get medical care You should restrict activities outside your home, except for getting medical care. Do not go to work, school, or public areas, and do not use public transportation or taxis.  Call ahead before visiting your doctor Before your medical appointment, call the healthcare provider and tell them that you  have, or are being evaluated for, COVID-19 infection. This will help the healthcare provider's office take steps to keep other people from getting infected. Ask your healthcare provider to call the local or state health department.  Monitor your symptoms Seek prompt medical attention if your illness is worsening (e.g., difficulty breathing). Before going to your medical appointment, call the healthcare provider and tell them that you have, or are being evaluated for, COVID-19 infection. Ask your healthcare provider to call the local or state health department.  Wear a facemask You should wear a facemask that covers your nose and mouth when you are in the same room with other people and when you visit a healthcare provider. People who live with or visit you should also wear a facemask while they are in the same room with you.  Separate yourself from other people in your home As much as possible, you should stay in a different room from other people in your home. Also, you should use a separate bathroom, if available.  Avoid sharing household items You should not share dishes, drinking glasses, cups, eating utensils, towels, bedding, or other items with other people in your home. After using these items, you should wash them thoroughly with soap and water.  Cover your coughs and sneezes Cover your mouth and nose with a tissue when you cough or sneeze, or you can cough or sneeze into your sleeve. Throw used tissues in a lined trash can, and immediately wash your hands with soap and water for at least 20 seconds or use an  alcohol-based hand rub.  Wash your [employer] your hands often and thoroughly with soap and water for at least 20 seconds. You can use an alcohol-based hand sanitizer if soap and water are not available and if your hands are not visibly dirty. Avoid touching your eyes, nose, and mouth with unwashed hands.   Prevention Steps for Caregivers and Household Members  of Individuals Confirmed to have, or Being Evaluated for, COVID-19 Infection Being Cared for in the Home  If you live with, or provide care at home for, a person confirmed to have, or being evaluated for, COVID-19 infection please follow these guidelines to prevent infection:  Follow healthcare provider's instructions Make sure that you understand and can help the patient follow any healthcare provider instructions for all care.  Provide for the patient's basic needs You should help the patient with basic needs in the home and provide support for getting groceries, prescriptions, and other personal needs.  Monitor the patient's symptoms If they are getting sicker, call his or her medical provider and tell them that the patient has, or is being evaluated for, COVID-19 infection. This will help the healthcare provider's office take steps to keep other people from getting infected. Ask the healthcare provider to call the local or state health department.  Limit the number of people who have contact with the patient If possible, have only one caregiver for the patient. Other household members should stay in another home or place of residence. If this is not possible, they should stay in another room, or be separated from the patient as much as possible. Use a separate bathroom, if available. Restrict visitors who do not have an essential need to be in the home.  Keep older adults, very young children, and other sick people away from the patient Keep older adults, very young children, and those who have compromised immune systems or chronic health conditions away from the patient. This includes people with chronic heart, lung, or kidney conditions, diabetes, and cancer.  Ensure good ventilation Make sure that shared spaces in the home have good air flow, such as from an air conditioner or an opened window, weather permitting.  Wash your hands often Wash your hands often and thoroughly with  soap and water for at least 20 seconds. You can use an alcohol based hand sanitizer if soap and water are not available and if your hands are not visibly dirty. Avoid touching your eyes, nose, and mouth with unwashed hands. Use disposable paper towels to dry your hands. If not available, use dedicated cloth towels and replace them when they become wet.  Wear a facemask and gloves Wear a disposable facemask at all times in the room and gloves when you touch or have contact with the patient's blood, body fluids, and/or secretions or excretions, such as sweat, saliva, sputum, nasal mucus, vomit, urine, or feces.  Ensure the mask fits over your nose and mouth tightly, and do not touch it during use. Throw out disposable facemasks and gloves after using them. Do not reuse. Wash your hands immediately after removing your facemask and gloves. If your personal clothing becomes contaminated, carefully remove clothing and launder. Wash your hands after handling contaminated clothing. Place all used disposable facemasks, gloves, and other waste in a lined container before disposing them with other household waste. Remove gloves and wash your hands immediately after handling these items.  Do not share dishes, glasses, or other household items with the patient Avoid sharing household items. You should  not share dishes, drinking glasses, cups, eating utensils, towels, bedding, or other items with a patient who is confirmed to have, or being evaluated for, COVID-19 infection. After the person uses these items, you should wash them thoroughly with soap and water.  Wash laundry thoroughly Immediately remove and wash clothes or bedding that have blood, body fluids, and/or secretions or excretions, such as sweat, saliva, sputum, nasal mucus, vomit, urine, or feces, on them. Wear gloves when handling laundry from the patient. Read and follow directions on labels of laundry or clothing items and detergent. In general,  wash and dry with the warmest temperatures recommended on the label.  Clean all areas the individual has used often Clean all touchable surfaces, such as counters, tabletops, doorknobs, bathroom fixtures, toilets, phones, keyboards, tablets, and bedside tables, every day. Also, clean any surfaces that may have blood, body fluids, and/or secretions or excretions on them. Wear gloves when cleaning surfaces the patient has come in contact with. Use a diluted bleach solution (e.g., dilute bleach with 1 part bleach and 10 parts water) or a household disinfectant with a label that says EPA-registered for coronaviruses. To make a bleach solution at home, add 1 tablespoon of bleach to 1 quart (4 cups) of water. For a larger supply, add  cup of bleach to 1 gallon (16 cups) of water. Read labels of cleaning products and follow recommendations provided on product labels. Labels contain instructions for safe and effective use of the cleaning product including precautions you should take when applying the product, such as wearing gloves or eye protection and making sure you have good ventilation during use of the product. Remove gloves and wash hands immediately after cleaning.  Monitor yourself for signs and symptoms of illness Caregivers and household members are considered close contacts, should monitor their health, and will be asked to limit movement outside of the home to the extent possible. Follow the monitoring steps for close contacts listed on the symptom monitoring form.   ? If you have additional questions, contact your local health department or call the epidemiologist on call at [phone] (available 24/7). ? This guidance is subject to change. For the most up-to-date guidance from [facility], please refer to their website: [email or web address]

## 2020-06-18 ENCOUNTER — Other Ambulatory Visit: Payer: Self-pay

## 2020-06-18 ENCOUNTER — Emergency Department (HOSPITAL_COMMUNITY): Payer: Self-pay

## 2020-06-18 ENCOUNTER — Encounter (HOSPITAL_COMMUNITY): Payer: Self-pay | Admitting: Emergency Medicine

## 2020-06-18 ENCOUNTER — Emergency Department (HOSPITAL_COMMUNITY)
Admission: EM | Admit: 2020-06-18 | Discharge: 2020-06-18 | Disposition: A | Discharge status: Home / Self Care | Payer: Self-pay | Attending: Emergency Medicine | Admitting: Emergency Medicine

## 2020-06-18 DIAGNOSIS — M546 Pain in thoracic spine: Secondary | ICD-10-CM | POA: Insufficient documentation

## 2020-06-18 MED ORDER — IBUPROFEN 400 MG PO TABS
600.0000 mg | ORAL_TABLET | Freq: Once | ORAL | Status: AC
  Administered 2020-06-18: 600 mg via ORAL
  Filled 2020-06-18: qty 1

## 2020-06-18 MED ORDER — METHOCARBAMOL 500 MG PO TABS: 500.0000 mg | ORAL_TABLET | Freq: Two times a day (BID) | ORAL | 0 refills | Status: AC

## 2020-06-18 MED ORDER — LIDOCAINE 5 % EX PTCH
2.0000 | MEDICATED_PATCH | CUTANEOUS | Status: DC
  Administered 2020-06-18: 2 via TRANSDERMAL
  Filled 2020-06-18: qty 2

## 2020-06-18 MED ORDER — LIDOCAINE 5 % EX PTCH: 1.0000 | MEDICATED_PATCH | CUTANEOUS | 0 refills | Status: AC

## 2020-06-18 NOTE — Discharge Instructions (Addendum)

## 2020-06-18 NOTE — ED Notes (Signed)
Patient verbalizes understanding of discharge instructions. Opportunity for questioning and answers were provided. Armband removed by staff, pt discharged from ED ambulatory by self\  
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
## 2020-09-14 ENCOUNTER — Emergency Department (HOSPITAL_COMMUNITY): Payer: Self-pay

## 2020-09-14 ENCOUNTER — Other Ambulatory Visit: Payer: Self-pay

## 2020-09-14 ENCOUNTER — Encounter (HOSPITAL_COMMUNITY): Payer: Self-pay | Admitting: *Deleted

## 2020-09-14 ENCOUNTER — Emergency Department (HOSPITAL_COMMUNITY)
Admission: EM | Admit: 2020-09-14 | Discharge: 2020-09-14 | Disposition: A | Discharge status: Home / Self Care | Payer: Self-pay | Attending: Emergency Medicine | Admitting: Emergency Medicine

## 2020-09-14 DIAGNOSIS — M25511 Pain in right shoulder: Secondary | ICD-10-CM | POA: Insufficient documentation

## 2020-09-14 DIAGNOSIS — R1032 Left lower quadrant pain: Secondary | ICD-10-CM

## 2020-09-14 DIAGNOSIS — Y92410 Unspecified street and highway as the place of occurrence of the external cause: Secondary | ICD-10-CM | POA: Insufficient documentation

## 2020-09-14 DIAGNOSIS — R1030 Lower abdominal pain, unspecified: Secondary | ICD-10-CM | POA: Insufficient documentation

## 2020-09-14 MED ORDER — IBUPROFEN 600 MG PO TABS: 600.0000 mg | ORAL_TABLET | Freq: Four times a day (QID) | ORAL | 0 refills | Status: AC | PRN

## 2020-09-14 MED ORDER — IBUPROFEN 200 MG PO TABS
600.0000 mg | ORAL_TABLET | Freq: Once | ORAL | Status: AC
  Administered 2020-09-14: 600 mg via ORAL
  Filled 2020-09-14: qty 3

## 2020-09-14 NOTE — Discharge Instructions (Addendum)
Your xrays are normal. Apply ice to your areas of pain for 20 minutes at a time. You can take ibuprofen every 6 hours as needed for pain. Schedule an appointment with your primary care provider to follow up on your injuries if symptoms persist.
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
## 2022-06-08 ENCOUNTER — Emergency Department (HOSPITAL_COMMUNITY)
Admission: EM | Admit: 2022-06-08 | Discharge: 2022-06-08 | Disposition: A | Discharge status: Home / Self Care | Payer: Medicaid Other | Attending: Emergency Medicine | Admitting: Emergency Medicine

## 2022-06-08 ENCOUNTER — Other Ambulatory Visit: Payer: Self-pay

## 2022-06-08 ENCOUNTER — Encounter (HOSPITAL_COMMUNITY): Payer: Self-pay | Admitting: Emergency Medicine

## 2022-06-08 DIAGNOSIS — K0381 Cracked tooth: Secondary | ICD-10-CM | POA: Insufficient documentation

## 2022-06-08 DIAGNOSIS — K029 Dental caries, unspecified: Secondary | ICD-10-CM | POA: Insufficient documentation

## 2022-06-08 MED ORDER — NAPROXEN 500 MG PO TABS: 500.0000 mg | ORAL_TABLET | Freq: Two times a day (BID) | ORAL | 0 refills | Status: AC

## 2022-06-08 MED ORDER — ACETAMINOPHEN 325 MG PO TABS
650.0000 mg | ORAL_TABLET | Freq: Once | ORAL | Status: AC
  Administered 2022-06-08: 650 mg via ORAL
  Filled 2022-06-08: qty 2

## 2022-06-08 MED ORDER — AMOXICILLIN-POT CLAVULANATE 875-125 MG PO TABS: 1.0000 | ORAL_TABLET | Freq: Two times a day (BID) | ORAL | 0 refills | Status: AC

## 2022-06-08 NOTE — ED Triage Notes (Signed)
Pt reports dental pain on the right side. Pt reports pain is also in that side of face.

## 2022-06-08 NOTE — Discharge Instructions (Addendum)
An antibiotic by the name of Augmentin has been sent to the pharmacy.  Take 1 tablet every 12 hours for the next 7 days.  Always take with plenty of food and water.  You have been prescribed an anti-inflammatory by the name of naproxen.  You may take 1 tablet every 12 hours as needed for pain relief.  Always take with plenty of food and water.  Do not take ibuprofen or Motrin on top of this, stay on same medication family.  Though you may take Tylenol in addition to the naproxen.  You have also been provided dental resources.  Please call to schedule a follow-up within the next 3 to 5 days for reevaluation continue medical management.  In addition, further resources and information of dental pain have been provided for you to review at your leisure.  Return to the ED for any new or worsening symptoms as discussed.

## 2022-06-08 NOTE — ED Provider Notes (Signed)
[facility]-EMERGENCY DEPT Provider Note   CSN: [id number] Arrival date & time: 06/08/22  9476     History  Chief Complaint  Patient presents with   Dental Pain    Douglas Cooper is a 51 y.o. male presenting with chief complaint of dental pain.  Pain characterized as sharp, intermittent, and of the right lower jaw.  Symptoms started 2 to 3 days ago, and has some radiation towards the right ear.  Denies neck swelling or stiffness, difficulty swallowing, fever, chills, chest pain, palpitations, headache, bleeding gums, or vision changes.  Has not tried anything to relieve the pain.  Worse when trying to eat or drink, or with palpation.  No recent antibiotic use.  No recent trauma or injury.  The history is provided by the patient and medical records.  Dental Pain    Home Medications Prior to Admission medications   Medication Sig Start Date End Date Taking? Authorizing Provider  amoxicillin-clavulanate (AUGMENTIN) 875-125 MG tablet Take 1 tablet by mouth every 12 (twelve) hours. 06/08/22  Yes Cecil Cobbs, PA-C  naproxen (NAPROSYN) 500 MG tablet Take 1 tablet (500 mg total) by mouth 2 (two) times daily. 06/08/22  Yes Cecil Cobbs, PA-C  HYDROcodone-acetaminophen (NORCO/VICODIN) 5-325 MG per tablet Take 1 tablet by mouth every 6 (six) hours as needed for pain. Patient not taking: Reported on 06/18/2020 01/12/13   Jaci Carrel, PA-C  ibuprofen (ADVIL) 600 MG tablet Take 1 tablet (600 mg total) by mouth every 6 (six) hours as needed. 09/14/20   Robinson, [place] N, PA-C  lidocaine (LIDODERM) 5 % Place 1 patch onto the skin daily. Remove & Discard patch within 12 hours or as directed by MD 06/18/20   Joy, Shawn C, PA-C  methocarbamol (ROBAXIN) 500 MG tablet Take 1 tablet (500 mg total) by mouth 2 (two) times daily. 06/18/20   Joy, Hillard Danker, PA-C      Allergies    Patient has no known allergies.    Review of Systems   Review of Systems  HENT:  Positive for dental  problem.     Physical Exam Updated Vital Signs BP (!) 136/101 (BP Location: Right Arm)   Pulse 64   Temp 98 F (36.7 C) (Oral)   Resp 17   SpO2 98%  Physical Exam Vitals and nursing note reviewed.  Constitutional:      General: He is not in acute distress.    Appearance: He is well-developed.  HENT:     Head: Normocephalic and atraumatic.     Right Ear: External ear normal.     Left Ear: External ear normal.     Nose: Nose normal.     Mouth/Throat:     Mouth: Mucous membranes are moist.     Pharynx: Oropharynx is clear.     Comments: No trismus.  No appreciated tongue swelling, deviation, or or elevation.  Without evidence of dental abscess.  Poor dentition overall.  Evidence of multiple dental caries and dental fractures, with mild gingival swelling, however without gingival bleeding.  Minimal tenderness of the right lower jaw near location of tooth #29. Eyes:     Conjunctiva/sclera: Conjunctivae normal.  Neck:     Comments: Very supple on exam, no torticollis or meningismus.  No evidence of neck swelling, cervical lymphadenopathy, or neck tenderness Cardiovascular:     Rate and Rhythm: Normal rate and regular rhythm.     Heart sounds: No murmur heard. Pulmonary:     Effort: Pulmonary  effort is normal. No respiratory distress.     Breath sounds: Normal breath sounds.  Chest:     Comments: No murmur auscultated Abdominal:     Palpations: Abdomen is soft.     Tenderness: There is no abdominal tenderness.  Musculoskeletal:        General: No swelling.     Cervical back: Neck supple. No rigidity.  Skin:    General: Skin is warm and dry.     Capillary Refill: Capillary refill takes less than 2 seconds.  Neurological:     Mental Status: He is alert and oriented to person, place, and time.  Psychiatric:        Mood and Affect: Mood normal.     ED Results / Procedures / Treatments   Labs (all labs ordered are listed, but only abnormal results are displayed) Labs  Reviewed - No data to display  EKG None  Radiology No results found.  Procedures Procedures    Medications Ordered in ED Medications  acetaminophen (TYLENOL) tablet 650 mg (650 mg Oral Given 06/08/22 1055)    ED Course/ Medical Decision Making/ A&P                           Medical Decision Making Risk OTC drugs. Prescription drug management.   51 y.o. male presents to the ED for concern of Dental Pain   This involves an extensive number of treatment options, and is a complaint that carries with it a high risk of complications and morbidity.  The emergent differential diagnosis prior to evaluation includes, but is not limited to: dental infection, dental abscess, dental caries  This is not an exhaustive differential.   Past Medical History / Co-morbidities / Social History: No relevant PMHx Social Determinants of Health include: without dentist, resources provided.  Additional History:  None  Lab Tests: None  Imaging Studies: None  ED Course: Pt well-appearing on exam.  Patient presenting with toothache of right lower jaw.  Nonseptic, non-ill appearing, in NAD.  Afebrile.  Poor dentition overall.  No visible purulent discharge or gross abscess.  Tongue not swollen or elevated.  Neck without swelling or significant tenderness, is very supple on exam.  Without reduced TMJ ROM.  Mild tenderness of affected area.  Some gingival swelling without bleeding of gums.  No murmur appreciated.  Exam unconcerning for Ludwig's angina or spread of infection.  Airway intact.  Pain managed in ED.  Plan to treat with Augmentin and anti-inflammatories.  Urged patient to follow-up closely with dentist.  Without dentists, resources provided.  Pt satisfied with today's encounter.  Patient in NAD and in good condition at time of discharge.  Disposition: After consideration the patient's encounter today, I do not feel today's workup suggests an emergent condition requiring admission or  immediate intervention beyond what has been performed at this time.  Safe for discharge; instructed to return immediately for worsening symptoms, change in symptoms or any other concerns.  I have reviewed the patients home medicines and have made adjustments as needed.  Discussed course of treatment with the patient, whom demonstrated understanding.  Patient in agreement and has no further questions.    I discussed this case with my attending physician Dr. Elpidio Anis, who agreed with the proposed treatment course and cosigned this note including patient's presenting symptoms, physical exam, and planned diagnostics and interventions.  Attending physician stated agreement with plan or made changes to plan which were implemented.  This chart was dictated using voice recognition software.  Despite best efforts to proofread, errors can occur which can change the documentation meaning.         Final Clinical Impression(s) / ED Diagnoses Final diagnoses:  Pain due to dental caries    Rx / DC Orders ED Discharge Orders          Ordered    amoxicillin-clavulanate (AUGMENTIN) 875-125 MG tablet  Every 12 hours        06/08/22 1108    naproxen (NAPROSYN) 500 MG tablet  2 times daily        06/08/22 1108              Cecil Cobbs, Cordelia Poche 06/08/22 1122    Mardene Sayer, MD 06/08/22 [phone]

## 2022-09-28 ENCOUNTER — Emergency Department (HOSPITAL_COMMUNITY): Payer: Medicaid Other

## 2022-09-28 ENCOUNTER — Emergency Department (HOSPITAL_COMMUNITY)
Admission: EM | Admit: 2022-09-28 | Discharge: 2022-09-28 | Disposition: A | Discharge status: Home / Self Care | Payer: Medicaid Other | Attending: Emergency Medicine | Admitting: Emergency Medicine

## 2022-09-28 ENCOUNTER — Other Ambulatory Visit: Payer: Self-pay

## 2022-09-28 ENCOUNTER — Encounter (HOSPITAL_COMMUNITY): Payer: Self-pay | Admitting: Emergency Medicine

## 2022-09-28 DIAGNOSIS — J90 Pleural effusion, not elsewhere classified: Secondary | ICD-10-CM | POA: Diagnosis not present

## 2022-09-28 DIAGNOSIS — R000 Tachycardia, unspecified: Secondary | ICD-10-CM | POA: Insufficient documentation

## 2022-09-28 DIAGNOSIS — R0602 Shortness of breath: Secondary | ICD-10-CM | POA: Diagnosis present

## 2022-09-28 DIAGNOSIS — J1008 Influenza due to other identified influenza virus with other specified pneumonia: Secondary | ICD-10-CM | POA: Insufficient documentation

## 2022-09-28 DIAGNOSIS — J101 Influenza due to other identified influenza virus with other respiratory manifestations: Secondary | ICD-10-CM | POA: Diagnosis not present

## 2022-09-28 DIAGNOSIS — Z20822 Contact with and (suspected) exposure to covid-19: Secondary | ICD-10-CM | POA: Insufficient documentation

## 2022-09-28 DIAGNOSIS — J189 Pneumonia, unspecified organism: Secondary | ICD-10-CM

## 2022-09-28 LAB — RESP PANEL BY RT-PCR (RSV, FLU A&B, COVID)  RVPGX2
Influenza A by PCR: POSITIVE — AB
Influenza B by PCR: NEGATIVE
Resp Syncytial Virus by PCR: NEGATIVE
SARS Coronavirus 2 by RT PCR: NEGATIVE

## 2022-09-28 MED ORDER — AZITHROMYCIN 250 MG PO TABS: 250.0000 mg | ORAL_TABLET | Freq: Every day | ORAL | 0 refills | Status: AC

## 2022-09-28 NOTE — ED Provider Notes (Signed)
[facility]-EMERGENCY DEPT Provider Note   CSN: [id number] Arrival date & time: 09/28/22  0945     History  Chief Complaint  Patient presents with   Cough    Douglas Cooper is a 51 y.o. male.  He is complaining of a cough productive of some white sputum and some burning in his chest that started about 3 days ago.  He is not sure if he has been running a fever.  He denies any nausea vomiting diarrhea.  He is a non-smoker.  The history is provided by the patient.  Cough Cough characteristics:  Productive Sputum characteristics:  White Severity:  Moderate Onset quality:  Gradual Duration:  3 days Timing:  Intermittent Progression:  Unchanged Chronicity:  New Smoker: no   Relieved by:  None tried Worsened by:  Activity Ineffective treatments:  None tried Associated symptoms: chest pain and shortness of breath   Associated symptoms: no fever        Home Medications Prior to Admission medications   Medication Sig Start Date End Date Taking? Authorizing Provider  amoxicillin-clavulanate (AUGMENTIN) 875-125 MG tablet Take 1 tablet by mouth every 12 (twelve) hours. 06/08/22   Cecil Cobbs, PA-C  HYDROcodone-acetaminophen (NORCO/VICODIN) 5-325 MG per tablet Take 1 tablet by mouth every 6 (six) hours as needed for pain. Patient not taking: Reported on 06/18/2020 01/12/13   Jaci Carrel, PA-C  ibuprofen (ADVIL) 600 MG tablet Take 1 tablet (600 mg total) by mouth every 6 (six) hours as needed. 09/14/20   Robinson, [place] N, PA-C  lidocaine (LIDODERM) 5 % Place 1 patch onto the skin daily. Remove & Discard patch within 12 hours or as directed by MD 06/18/20   Joy, Shawn C, PA-C  methocarbamol (ROBAXIN) 500 MG tablet Take 1 tablet (500 mg total) by mouth 2 (two) times daily. 06/18/20   Joy, Shawn C, PA-C  naproxen (NAPROSYN) 500 MG tablet Take 1 tablet (500 mg total) by mouth 2 (two) times daily. 06/08/22   Cecil Cobbs, PA-C      Allergies    Patient has no  known allergies.    Review of Systems   Review of Systems  Constitutional:  Negative for fever.  HENT:  Negative for trouble swallowing.   Respiratory:  Positive for cough and shortness of breath.   Cardiovascular:  Positive for chest pain.  Gastrointestinal:  Negative for diarrhea, nausea and vomiting.    Physical Exam Updated Vital Signs BP (!) 146/76 (BP Location: Left Arm)   Pulse (!) 104   Temp 99.1 F (37.3 C) (Oral)   Resp 16   SpO2 94%  Physical Exam Vitals and nursing note reviewed.  Constitutional:      General: He is not in acute distress.    Appearance: Normal appearance. He is well-developed.  HENT:     Head: Normocephalic and atraumatic.  Eyes:     Conjunctiva/sclera: Conjunctivae normal.  Cardiovascular:     Rate and Rhythm: Regular rhythm. Tachycardia present.     Heart sounds: No murmur heard. Pulmonary:     Effort: Pulmonary effort is normal. No respiratory distress.     Breath sounds: Rhonchi (few left base) present.  Abdominal:     Palpations: Abdomen is soft.     Tenderness: There is no abdominal tenderness.  Musculoskeletal:     Cervical back: Neck supple.     Right lower leg: No edema.     Left lower leg: No edema.  Skin:  General: Skin is warm and dry.     Capillary Refill: Capillary refill takes less than 2 seconds.  Neurological:     General: No focal deficit present.     Mental Status: He is alert.     ED Results / Procedures / Treatments   Labs (all labs ordered are listed, but only abnormal results are displayed) Labs Reviewed  RESP PANEL BY RT-PCR (RSV, FLU A&B, COVID)  RVPGX2 - Abnormal; Notable for the following components:      Result Value   Influenza A by PCR POSITIVE (*)    All other components within normal limits    EKG None  Radiology DG Chest 2 View  Result Date: 09/28/2022 CLINICAL DATA:  Day history of cough and subjective fevers EXAM: CHEST - 2 VIEW COMPARISON:  Chest radiograph dated 06/18/2020 FINDINGS:  Normal lung volumes. Left lower lobe patchy opacities. Trace bilateral pleural effusions. No pneumothorax. The heart size and mediastinal contours are within normal limits. Old healed fracture of the lateral left fourth rib. IMPRESSION: 1. Left lower lobe patchy opacities, may reflect atelectasis, aspiration, or pneumonia. 2. Trace bilateral pleural effusions. Electronically Signed   By: Agustin Cree M.D.   On: 09/28/2022 11:35    Procedures Procedures    Medications Ordered in ED Medications - No data to display  ED Course/ Medical Decision Making/ A&P                           Medical Decision Making Amount and/or Complexity of Data Reviewed Radiology: ordered.  Risk Prescription drug management.   This patient complains of cough; this involves an extensive number of treatment Options and is a complaint that carries with it a high risk of complications and morbidity. The differential includes pneumonia, COVID, flu, bronchitis  I ordered, reviewed and interpreted labs, which included influenza positive I ordered imaging studies which included chest x-ray and I independently    visualized and interpreted imaging which showed possible left lower lobe infiltrate Previous records obtained and reviewed in epic no recent admissions Social determinants considered, no significant barriers Critical Interventions: None  After the interventions stated above, I reevaluated the patient and found patient breathing comfortably with normal oxygenation Admission and further testing considered, no indications for admission at this time.  Will cover with antibiotics for possible bronchitis.  Return instructions discussed.         Final Clinical Impression(s) / ED Diagnoses Final diagnoses:  Community acquired pneumonia of left lower lobe of lung  Influenza B    Rx / DC Orders ED Discharge Orders          Ordered    azithromycin (ZITHROMAX) 250 MG tablet  Daily        09/28/22 1254               Terrilee Files, MD 09/28/22 1811

## 2022-09-28 NOTE — ED Triage Notes (Signed)
Pt reports cough and "feeling hot" x 3 days.

## 2022-09-28 NOTE — Discharge Instructions (Signed)
You are seen in the emergency department for evaluation of cough.  You tested positive for influenza.  Your chest x-ray also showed possible pneumonia so we are starting you on some antibiotics.  Please drink plenty of fluids, rest, Tylenol and ibuprofen for pain and fever.  Finish the antibiotics.  Return to the emergency department if any worsening or concerning symptoms

## 2022-10-01 ENCOUNTER — Telehealth: Payer: Self-pay

## 2022-10-01 NOTE — Patient Outreach (Signed)
Transition Care Management Unsuccessful Follow-up Telephone Call  Date of discharge and from where:  09/28/22 [facility]  Attempts:  1st Attempt  Reason for unsuccessful TCM follow-up call:  Left voice message   Gus Puma, BSW, [facility]  [facility]  High Risk Managed Medicaid Team  [phone]
